# Patient Record
Sex: Female | Born: 1980 | Race: White | Hispanic: No | Marital: Married | Smoking: Never smoker
Health system: Southern US, Community
[De-identification: ages and names within clinical notes are randomized; demographics above are authoritative.]

## PROBLEM LIST (undated history)

## (undated) DIAGNOSIS — Z972 Presence of dental prosthetic device (complete) (partial): Secondary | ICD-10-CM

## (undated) DIAGNOSIS — K610 Anal abscess: Secondary | ICD-10-CM

---

## 2011-02-22 ENCOUNTER — Other Ambulatory Visit (HOSPITAL_COMMUNITY): Payer: Self-pay | Admitting: Obstetrics and Gynecology

## 2011-02-22 DIAGNOSIS — N979 Female infertility, unspecified: Secondary | ICD-10-CM

## 2011-02-25 ENCOUNTER — Ambulatory Visit (HOSPITAL_COMMUNITY)
Admission: RE | Admit: 2011-02-25 | Discharge: 2011-02-25 | Disposition: A | Discharge status: Home / Self Care | Payer: BC Managed Care – PPO | Source: Ambulatory Visit | Attending: Obstetrics and Gynecology | Admitting: Obstetrics and Gynecology

## 2011-02-25 DIAGNOSIS — N979 Female infertility, unspecified: Secondary | ICD-10-CM | POA: Insufficient documentation

## 2012-10-15 IMAGING — RF DG HYSTEROGRAM
4 series · 4 of 4 positions shown · IV contrast (omnipaque)
Comparison: none

CLINICAL DATA: Primary infertility.  Assess tubal patency

HYSTEROSALPINGOGRAM
TECHNIQUE: Following cleansing of the cervix and vagina with
Betadine solution, a hysterosalpingogram was performed using a 5-
French hysterosalpingogram catheter and Omnipaque 300 contrast.
The patient tolerated the examination without difficulty.
Fluoroscopy time: 0.6 minutes.

[Series 1: run · 1 of 1 slices shown (1 of 4)]
[im 1/1]
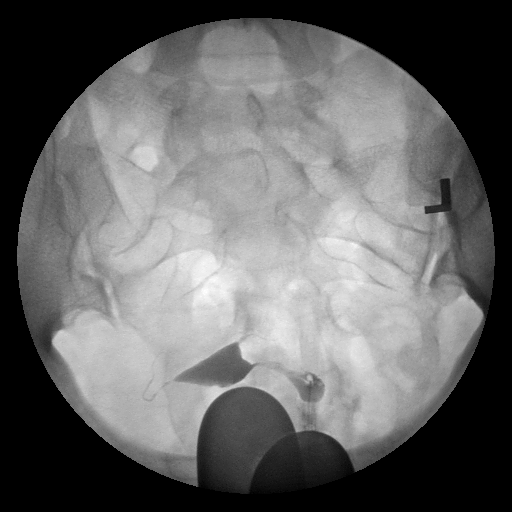

[Series 2: run · 1 of 1 slices shown (2 of 4)]
[im 1/1]
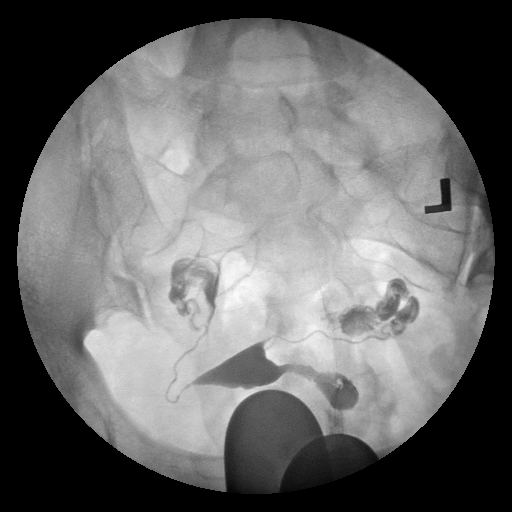

[Series 3: run · 1 of 1 slices shown (3 of 4)]
[im 1/1]
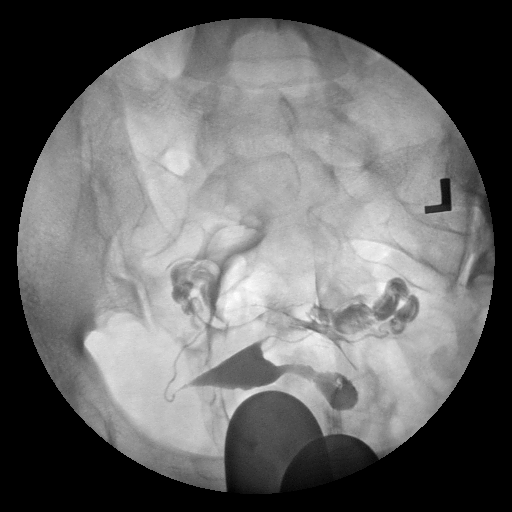

[Series 4: run · 1 of 1 slices shown (4 of 4)]
[im 1/1]
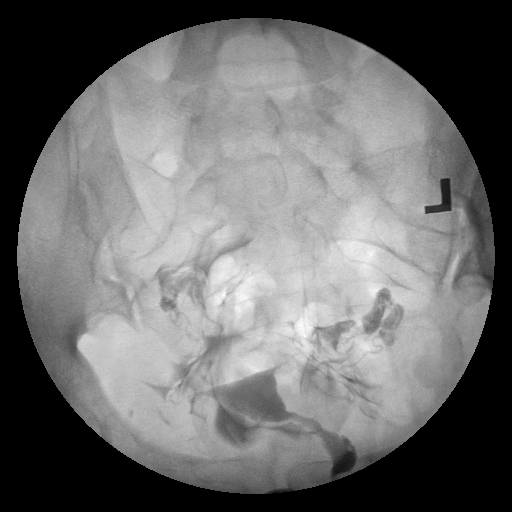

[4 of 4 positions shown; findings below may reference images not displayed]

FINDINGS: A normal endometrial morphology is seen.  Both fallopian
tubes demonstrate a normal morphology and bilateral free
intraperitoneal spill is seen.  No evidence for loculation of
contrast is identified within the pelvis to suggest the presence of
peritubal or periovarian adhesions.
IMPRESSION: Normal HSG

## 2013-07-01 HISTORY — PX: CHOLECYSTECTOMY: SHX55

## 2013-09-16 ENCOUNTER — Encounter (INDEPENDENT_AMBULATORY_CARE_PROVIDER_SITE_OTHER): Payer: Self-pay

## 2013-09-16 ENCOUNTER — Encounter (INDEPENDENT_AMBULATORY_CARE_PROVIDER_SITE_OTHER): Payer: Self-pay | Admitting: Surgery

## 2013-09-16 ENCOUNTER — Encounter (INDEPENDENT_AMBULATORY_CARE_PROVIDER_SITE_OTHER): Payer: Self-pay | Admitting: General Surgery

## 2013-09-16 ENCOUNTER — Ambulatory Visit (INDEPENDENT_AMBULATORY_CARE_PROVIDER_SITE_OTHER): Payer: BC Managed Care – PPO | Admitting: Surgery

## 2013-09-16 VITALS — BP 130/70 | HR 72 | Temp 98.7°F | Resp 14 | Ht 65.0 in | Wt 170.6 lb

## 2013-09-16 DIAGNOSIS — K611 Rectal abscess: Secondary | ICD-10-CM | POA: Insufficient documentation

## 2013-09-16 DIAGNOSIS — K612 Anorectal abscess: Secondary | ICD-10-CM

## 2013-09-16 NOTE — Progress Notes (Signed)
Patient ID: Courtney Dennis, female   DOB: Dec 31, 1980, 32 y.o.   MRN: [id number]  Chief Complaint  Patient presents with  . New Evaluation    perirectal abs/poss fistula    HPI Courtney Dennis is a 32 y.o. female.  URGENT OFFICE 09/16/13 referred by Arvilla Meres PA at [facility] for evaluation of perirectal abscess  HPI This is a 32 year old female in good health who presents with her third episode of a perirectal abscess. All 3 episodes have occurred in the same exact position to the left of her anus. These episodes are spaced about 2 years apart. The most recent episode started a week ago. The patient always has a palpable knot in this area but it became painful and started swelling. She was started on Bactrim but this was not helpful in resolving her symptoms. 2 days ago she underwent a local incision and drainage at [facility]. Both of her previous drainages were also at family practice offices. Her symptoms have improved slightly since Saturday. She is referred for possible anal fistula.  History reviewed. No pertinent past medical history.  Past Surgical History  Procedure Laterality Date  . Cholecystectomy      [id number]    Family History  Problem Relation Age of Onset  . Cancer Father     lung    Social History History  Substance Use Topics  . Smoking status: Never Smoker   . Smokeless tobacco: Never Used  . Alcohol Use: Yes     Comment: rare occasion    No Known Allergies  Current Outpatient Prescriptions  Medication Sig Dispense Refill  . HYDROcodone-acetaminophen (NORCO/VICODIN) 5-325 MG per tablet Take 1 tablet by mouth every 6 (six) hours as needed for moderate pain.      Marland Kitchen sulfamethoxazole-trimethoprim (BACTRIM DS) 800-160 MG per tablet Take 1 tablet by mouth 2 (two) times daily.       No current facility-administered medications for this visit.    Review of Systems Review of Systems  Constitutional: Negative for fever, chills and unexpected weight  change.  HENT: Negative for congestion, hearing loss, sore throat, trouble swallowing and voice change.   Eyes: Negative for visual disturbance.  Respiratory: Negative for cough and wheezing.   Cardiovascular: Negative for chest pain, palpitations and leg swelling.  Gastrointestinal: Negative for nausea, vomiting, abdominal pain, diarrhea, constipation, blood in stool, abdominal distention and anal bleeding.  Genitourinary: Negative for hematuria, vaginal bleeding and difficulty urinating.  Musculoskeletal: Negative for arthralgias.  Skin: Negative for rash and wound.  Neurological: Negative for seizures, syncope and headaches.  Hematological: Negative for adenopathy. Does not bruise/bleed easily.  Psychiatric/Behavioral: Negative for confusion.    Blood pressure 130/70, pulse 72, temperature 98.7 F (37.1 C), temperature source Temporal, resp. rate 14, height 5\' 5"  (1.651 m), weight 170 lb 9.6 oz (77.384 kg).  Physical Exam Physical Exam WDWN in NAD Rectal - no significant hemorrhoidal disease; 2 cm to the left of the anus is a small punctate opening with some minimal purulent drainage.  No surrounding swelling and minimal erythema.  No masses on digital rectal examination.  No bleeding or purulence noted per rectum Data Reviewed none  Assessment    Recurrent perirectal abscess     Plan    Based on the appearance of this drainage, my feeling is that this abscess has never been adequately drained. This wound has never been opened and packed. This likely contributes to the repeated recurrences. At this time the abscess seems to be healing. I  would not repeat the incision and drainage at this time. I would like to see her back in 3-4 weeks for followup visit. I also encouraged her to come see [place] for this problem rather go to urgent care. She may finish her current course of antibiotics.        Jessy Cybulski K. 09/16/2013, 3:59 PM

## 2013-09-23 ENCOUNTER — Ambulatory Visit (INDEPENDENT_AMBULATORY_CARE_PROVIDER_SITE_OTHER): Payer: BC Managed Care – PPO | Admitting: Surgery

## 2013-09-23 ENCOUNTER — Encounter (INDEPENDENT_AMBULATORY_CARE_PROVIDER_SITE_OTHER): Payer: Self-pay | Admitting: Surgery

## 2013-09-23 VITALS — BP 116/78 | HR 60 | Resp 16 | Ht 65.0 in | Wt 173.0 lb

## 2013-09-23 DIAGNOSIS — K612 Anorectal abscess: Secondary | ICD-10-CM

## 2013-09-23 DIAGNOSIS — K611 Rectal abscess: Secondary | ICD-10-CM

## 2013-09-23 NOTE — Progress Notes (Signed)
Followup of her perirectal abscess. She had some concern last week because she was having more drainage. It seems to be healing up nicely. She is done with her antibiotics.  Filed Vitals:   09/23/13 1512  BP: 116/78  Pulse: 60  Resp: 16     The opening is almost completely healed. The induration and erythema have resolved. No masses on digital rectal examination. No gross blood or purulence noted. The patient to continue with Neosporin over this area until completely healed. She has an appointment for 2 weeks.  Wilmon Arms. Corliss Skains, MD, [facility]  General/ Trauma Surgery  09/23/2013 3:22 PM

## 2013-09-30 ENCOUNTER — Encounter (INDEPENDENT_AMBULATORY_CARE_PROVIDER_SITE_OTHER): Payer: BC Managed Care – PPO | Admitting: Surgery

## 2013-09-30 DIAGNOSIS — K610 Anal abscess: Secondary | ICD-10-CM

## 2013-09-30 HISTORY — DX: Anal abscess: K61.0

## 2013-10-07 ENCOUNTER — Ambulatory Visit (INDEPENDENT_AMBULATORY_CARE_PROVIDER_SITE_OTHER): Payer: BC Managed Care – PPO | Admitting: Surgery

## 2013-10-07 ENCOUNTER — Encounter (HOSPITAL_BASED_OUTPATIENT_CLINIC_OR_DEPARTMENT_OTHER): Payer: Self-pay | Admitting: *Deleted

## 2013-10-07 ENCOUNTER — Encounter (INDEPENDENT_AMBULATORY_CARE_PROVIDER_SITE_OTHER): Payer: Self-pay | Admitting: Surgery

## 2013-10-07 ENCOUNTER — Telehealth (INDEPENDENT_AMBULATORY_CARE_PROVIDER_SITE_OTHER): Payer: Self-pay | Admitting: Surgery

## 2013-10-07 VITALS — BP 120/64 | HR 66 | Temp 98.9°F | Resp 14 | Ht 65.0 in | Wt 177.2 lb

## 2013-10-07 DIAGNOSIS — K612 Anorectal abscess: Secondary | ICD-10-CM

## 2013-10-07 DIAGNOSIS — K611 Rectal abscess: Secondary | ICD-10-CM

## 2013-10-07 NOTE — Telephone Encounter (Signed)
Surgery 10/10/13  Please schedule post op  Thanks

## 2013-10-07 NOTE — Progress Notes (Signed)
Patient ID: Courtney Dennis, female   DOB: 11/07/1980, 32 y.o.   MRN: [id number] Patient presents with   .  New Evaluation     perirectal abs/poss fistula   HPI  Courtney Dennis is a 31 y.o. female.Referred by Kelly Phillips PA at [facility] for evaluation of perirectal abscess  HPI  This is a 31-year-old female in good health who presents with her third episode of a perirectal abscess. All 3 episodes have occurred in the same exact position to the left of her anus. These episodes are spaced about 2 years apart. The most recent episode started a week ago. The patient always has a palpable knot in this area but it became painful and started swelling. She was started on Bactrim but this was not helpful in resolving her symptoms. 2 days ago she underwent a local incision and drainage at [facility]. Both of her previous drainages were also at family practice offices. Her symptoms have improved slightly since Saturday. She is referred for possible anal fistula.   The area has improved on antibiotics, but continues to drain small amounts each day.  Still with some tenderness.    History reviewed. No pertinent past medical history.  Past Surgical History   Procedure  Laterality  Date   .  Cholecystectomy       [id number]    Family History   Problem  Relation  Age of Onset   .  Cancer  Father      lung   Social History  History   Substance Use Topics   .  Smoking status:  Never Smoker   .  Smokeless tobacco:  Never Used   .  Alcohol Use:  Yes      Comment: rare occasion   No Known Allergies  Current Outpatient Prescriptions   Medication  Sig  Dispense  Refill   .  HYDROcodone-acetaminophen (NORCO/VICODIN) 5-325 MG per tablet  Take 1 tablet by mouth every 6 (six) hours as needed for moderate pain.     .  sulfamethoxazole-trimethoprim (BACTRIM DS) 800-160 MG per tablet  Take 1 tablet by mouth 2 (two) times daily.      No current facility-administered medications for this visit.   Review of  Systems  Review of Systems  Constitutional: Negative for fever, chills and unexpected weight change.  HENT: Negative for congestion, hearing loss, sore throat, trouble swallowing and voice change.  Eyes: Negative for visual disturbance.  Respiratory: Negative for cough and wheezing.  Cardiovascular: Negative for chest pain, palpitations and leg swelling.  Gastrointestinal: Negative for nausea, vomiting, abdominal pain, diarrhea, constipation, blood in stool, abdominal distention and anal bleeding.  Genitourinary: Negative for hematuria, vaginal bleeding and difficulty urinating.  Musculoskeletal: Negative for arthralgias.  Skin: Negative for rash and wound.  Neurological: Negative for seizures, syncope and headaches.  Hematological: Negative for adenopathy. Does not bruise/bleed easily.  Psychiatric/Behavioral: Negative for confusion.  Blood pressure 130/70, pulse 72, temperature 98.7 F (37.1 C), temperature source Temporal, resp. rate 14, height 5' 5" (1.651 m), weight 170 lb 9.6 oz (77.384 kg).  Physical Exam  Physical Exam  WDWN in NAD HEENT:  EOMI, sclera anicteric Neck:  No masses, no thyromegaly Lungs:  CTA bilaterally; normal respiratory effort CV:  Regular rate and rhythm; no murmurs Abd:  +bowel sounds, soft, non-tender, no masses Rectal - located to the left of the anus about 2 cm from the edge of the anus, there is a small opening without induration or erythema; small amount   of drainage Ext:  Well-perfused; no edema Skin:  Warm, dry; no sign of jaundice  Data Reviewed  none  Assessment  Recurrent perirectal abscess  Plan  Since she continues to have drainage from this area, there is possibility that this has not been adequately drained or that she has a small chronic fistula to this area. I would recommend an examination under anesthesia with a probe into the abscess to see if there is communication to the rectum. There is, that we would perform a fistulotomy. The  surgical procedure has been discussed with the patient.  Potential risks, benefits, alternative treatments, and expected outcomes have been explained.  All of the patient's questions at this time have been answered.  The likelihood of reaching the patient's treatment goal is good.  The patient understand the proposed surgical procedure and wishes to proceed.    Freddie Dymek K. Reyah Streeter, MD, FACS [facility]  General/ Trauma Surgery  10/07/2013 11:10 AM   

## 2013-10-10 ENCOUNTER — Encounter (HOSPITAL_BASED_OUTPATIENT_CLINIC_OR_DEPARTMENT_OTHER): Payer: BC Managed Care – PPO | Admitting: Anesthesiology

## 2013-10-10 ENCOUNTER — Ambulatory Visit (HOSPITAL_BASED_OUTPATIENT_CLINIC_OR_DEPARTMENT_OTHER)
Admission: RE | Admit: 2013-10-10 | Discharge: 2013-10-10 | Disposition: A | Discharge status: Home / Self Care | Payer: BC Managed Care – PPO | Source: Ambulatory Visit | Attending: Surgery | Admitting: Surgery

## 2013-10-10 ENCOUNTER — Encounter (HOSPITAL_BASED_OUTPATIENT_CLINIC_OR_DEPARTMENT_OTHER)
Admission: RE | Disposition: A | Discharge status: Home / Self Care | Payer: Self-pay | Source: Ambulatory Visit | Attending: Surgery

## 2013-10-10 ENCOUNTER — Ambulatory Visit (HOSPITAL_BASED_OUTPATIENT_CLINIC_OR_DEPARTMENT_OTHER): Payer: BC Managed Care – PPO | Admitting: Anesthesiology

## 2013-10-10 ENCOUNTER — Encounter (HOSPITAL_BASED_OUTPATIENT_CLINIC_OR_DEPARTMENT_OTHER): Payer: Self-pay | Admitting: Anesthesiology

## 2013-10-10 DIAGNOSIS — Z79899 Other long term (current) drug therapy: Secondary | ICD-10-CM | POA: Insufficient documentation

## 2013-10-10 DIAGNOSIS — K612 Anorectal abscess: Secondary | ICD-10-CM | POA: Insufficient documentation

## 2013-10-10 DIAGNOSIS — K611 Rectal abscess: Secondary | ICD-10-CM

## 2013-10-10 HISTORY — PX: EVALUATION UNDER ANESTHESIA WITH FISTULECTOMY: SHX5623

## 2013-10-10 HISTORY — DX: Anal abscess: K61.0

## 2013-10-10 HISTORY — DX: Presence of dental prosthetic device (complete) (partial): Z97.2

## 2013-10-10 SURGERY — EXAM UNDER ANESTHESIA WITH FISTULECTOMY
Anesthesia: General

## 2013-10-10 MED ORDER — MORPHINE SULFATE 2 MG/ML IJ SOLN: 2.0000 mg | INTRAMUSCULAR | Status: DC | PRN

## 2013-10-10 MED ORDER — ONDANSETRON HCL 4 MG/2ML IJ SOLN: 4.0000 mg | INTRAMUSCULAR | Status: DC | PRN

## 2013-10-10 MED ORDER — BUPIVACAINE LIPOSOME 1.3 % IJ SUSP
INTRAMUSCULAR | Status: AC
  Filled 2013-10-10: qty 20

## 2013-10-10 MED ORDER — MIDAZOLAM HCL 5 MG/5ML IJ SOLN
INTRAMUSCULAR | Status: DC | PRN
  Administered 2013-10-10: 2 mg via INTRAVENOUS

## 2013-10-10 MED ORDER — FENTANYL CITRATE 0.05 MG/ML IJ SOLN
INTRAMUSCULAR | Status: AC
  Filled 2013-10-10: qty 6

## 2013-10-10 MED ORDER — OXYCODONE-ACETAMINOPHEN 5-325 MG PO TABS: 1.0000 | ORAL_TABLET | ORAL | Status: DC | PRN

## 2013-10-10 MED ORDER — PROPOFOL 10 MG/ML IV BOLUS
INTRAVENOUS | Status: DC | PRN
  Administered 2013-10-10: 200 mg via INTRAVENOUS

## 2013-10-10 MED ORDER — CEFAZOLIN SODIUM-DEXTROSE 2-3 GM-% IV SOLR: 2.0000 g | INTRAVENOUS | Status: DC

## 2013-10-10 MED ORDER — MIDAZOLAM HCL 2 MG/2ML IJ SOLN: 1.0000 mg | INTRAMUSCULAR | Status: DC | PRN

## 2013-10-10 MED ORDER — FENTANYL CITRATE 0.05 MG/ML IJ SOLN
INTRAMUSCULAR | Status: DC | PRN
  Administered 2013-10-10: 100 ug via INTRAVENOUS

## 2013-10-10 MED ORDER — DIBUCAINE 1 % RE OINT
TOPICAL_OINTMENT | RECTAL | Status: AC
  Filled 2013-10-10: qty 28

## 2013-10-10 MED ORDER — FENTANYL CITRATE 0.05 MG/ML IJ SOLN: 50.0000 ug | INTRAMUSCULAR | Status: DC | PRN

## 2013-10-10 MED ORDER — DEXAMETHASONE SODIUM PHOSPHATE 4 MG/ML IJ SOLN
INTRAMUSCULAR | Status: DC | PRN
  Administered 2013-10-10: 10 mg via INTRAVENOUS

## 2013-10-10 MED ORDER — OXYCODONE HCL 5 MG PO TABS: 5.0000 mg | ORAL_TABLET | Freq: Once | ORAL | Status: DC | PRN

## 2013-10-10 MED ORDER — MIDAZOLAM HCL 2 MG/ML PO SYRP: 12.0000 mg | ORAL_SOLUTION | Freq: Once | ORAL | Status: DC | PRN

## 2013-10-10 MED ORDER — LACTATED RINGERS IV SOLN
INTRAVENOUS | Status: DC
  Administered 2013-10-10: 14:00:00 via INTRAVENOUS

## 2013-10-10 MED ORDER — OXYCODONE HCL 5 MG/5ML PO SOLN: 5.0000 mg | Freq: Once | ORAL | Status: DC | PRN

## 2013-10-10 MED ORDER — LIDOCAINE HCL (CARDIAC) 20 MG/ML IV SOLN
INTRAVENOUS | Status: DC | PRN
  Administered 2013-10-10: 60 mg via INTRAVENOUS

## 2013-10-10 MED ORDER — MIDAZOLAM HCL 2 MG/2ML IJ SOLN
INTRAMUSCULAR | Status: AC
  Filled 2013-10-10: qty 2

## 2013-10-10 MED ORDER — HYDROMORPHONE HCL PF 1 MG/ML IJ SOLN: 0.2500 mg | INTRAMUSCULAR | Status: DC | PRN

## 2013-10-10 MED ORDER — BUPIVACAINE LIPOSOME 1.3 % IJ SUSP
INTRAMUSCULAR | Status: DC | PRN
  Administered 2013-10-10: 20 mL

## 2013-10-10 MED ORDER — KETOROLAC TROMETHAMINE 30 MG/ML IJ SOLN
INTRAMUSCULAR | Status: DC | PRN
  Administered 2013-10-10: 30 mg via INTRAVENOUS

## 2013-10-10 MED ORDER — METOCLOPRAMIDE HCL 5 MG/ML IJ SOLN: 10.0000 mg | Freq: Once | INTRAMUSCULAR | Status: DC | PRN

## 2013-10-10 MED ORDER — CHLORHEXIDINE GLUCONATE 4 % EX LIQD: 1.0000 "application " | Freq: Once | CUTANEOUS | Status: DC

## 2013-10-10 SURGICAL SUPPLY — 41 items
BENZOIN TINCTURE PRP APPL 2/3 (GAUZE/BANDAGES/DRESSINGS) IMPLANT
BLADE SURG 15 STRL LF DISP TIS (BLADE) ×1 IMPLANT
BLADE SURG 15 STRL SS (BLADE) ×1
BRIEF STRETCH FOR OB PAD LRG (UNDERPADS AND DIAPERS) ×2 IMPLANT
CANISTER SUCT 1200ML W/VALVE (MISCELLANEOUS) ×2 IMPLANT
COVER MAYO STAND STRL (DRAPES) IMPLANT
COVER TABLE BACK 60X90 (DRAPES) IMPLANT
DECANTER SPIKE VIAL GLASS SM (MISCELLANEOUS) IMPLANT
DRAPE PED LAPAROTOMY (DRAPES) IMPLANT
DRAPE UTILITY XL STRL (DRAPES) ×2 IMPLANT
ELECT REM PT RETURN 9FT ADLT (ELECTROSURGICAL) ×2
ELECTRODE REM PT RTRN 9FT ADLT (ELECTROSURGICAL) ×1 IMPLANT
GAUZE PACKING IODOFORM 1/4X5 (PACKING) ×2 IMPLANT
GAUZE SPONGE 4X4 12PLY STRL LF (GAUZE/BANDAGES/DRESSINGS) IMPLANT
GAUZE VASELINE 1X8 (GAUZE/BANDAGES/DRESSINGS) IMPLANT
GLOVE BIO SURGEON STRL SZ7 (GLOVE) ×2 IMPLANT
GLOVE BIOGEL PI IND STRL 7.0 (GLOVE) ×1 IMPLANT
GLOVE BIOGEL PI IND STRL 7.5 (GLOVE) ×2 IMPLANT
GLOVE BIOGEL PI INDICATOR 7.0 (GLOVE) ×1
GLOVE BIOGEL PI INDICATOR 7.5 (GLOVE) ×2
GLOVE SURG SS PI 7.0 STRL IVOR (GLOVE) ×2 IMPLANT
GOWN PREVENTION PLUS XLARGE (GOWN DISPOSABLE) ×4 IMPLANT
NEEDLE HYPO 25X1 1.5 SAFETY (NEEDLE) ×2 IMPLANT
PACK BASIN DAY SURGERY FS (CUSTOM PROCEDURE TRAY) ×2 IMPLANT
PACK LITHOTOMY IV (CUSTOM PROCEDURE TRAY) ×2 IMPLANT
PAD ABD 8X10 STRL (GAUZE/BANDAGES/DRESSINGS) ×2 IMPLANT
PENCIL BUTTON HOLSTER BLD 10FT (ELECTRODE) ×2 IMPLANT
SPONGE SURGIFOAM ABS GEL 100 (HEMOSTASIS) IMPLANT
SURGILUBE 2OZ TUBE FLIPTOP (MISCELLANEOUS) ×2 IMPLANT
SUT CHROMIC 2 0 SH (SUTURE) IMPLANT
SUT CHROMIC 3 0 SH 27 (SUTURE) IMPLANT
SUT VIC AB 3-0 SH 27 (SUTURE)
SUT VIC AB 3-0 SH 27X BRD (SUTURE) IMPLANT
SYR CONTROL 10ML LL (SYRINGE) ×2 IMPLANT
TAPE CLOTH 3X10 TAN LF (GAUZE/BANDAGES/DRESSINGS) IMPLANT
TOWEL OR 17X24 6PK STRL BLUE (TOWEL DISPOSABLE) ×2 IMPLANT
TOWEL OR NON WOVEN STRL DISP B (DISPOSABLE) ×2 IMPLANT
TRAY DSU PREP LF (CUSTOM PROCEDURE TRAY) ×2 IMPLANT
TRAY PROCTOSCOPIC FIBER OPTIC (SET/KITS/TRAYS/PACK) IMPLANT
TUBE CONNECTING 20X1/4 (TUBING) ×2 IMPLANT
YANKAUER SUCT BULB TIP NO VENT (SUCTIONS) ×2 IMPLANT

## 2013-10-10 NOTE — Transfer of Care (Signed)
Immediate Anesthesia Transfer of Care Note  Patient: Courtney Dennis  Procedure(s) Performed: Procedure(s): EXAM UNDER ANESTHESIA WITH FISTULotomy  (N/A)  Patient Location: PACU  Anesthesia Type:General  Level of Consciousness: awake, alert  and oriented  Airway & Oxygen Therapy: Patient Spontanous Breathing and Patient connected to face mask oxygen  Post-op Assessment: Report given to PACU RN and Post -op Vital signs reviewed and stable  Post vital signs: Reviewed and stable  Complications: No apparent anesthesia complications

## 2013-10-10 NOTE — Anesthesia Postprocedure Evaluation (Signed)
Anesthesia Post Note  Patient: Courtney Dennis  Procedure(s) Performed: Procedure(s) (LRB): EXAM UNDER ANESTHESIA WITH EXCISION OF PERIRECTAL ABSCESS  (N/A)  Anesthesia type: General  Patient location: PACU  Post pain: Pain level controlled  Post assessment: Patient's Cardiovascular Status Stable  Last Vitals:  Filed Vitals:   10/10/13 1545  BP: 113/61  Pulse: 60  Temp:   Resp: 13    Post vital signs: Reviewed and stable  Level of consciousness: alert  Complications: No apparent anesthesia complications

## 2013-10-10 NOTE — Anesthesia Procedure Notes (Signed)
Procedure Name: LMA Insertion Performed by: Emersyn Kotarski W Pre-anesthesia Checklist: Patient identified, Timeout performed, Emergency Drugs available, Suction available and Patient being monitored Patient Re-evaluated:Patient Re-evaluated prior to inductionOxygen Delivery Method: Circle system utilized Preoxygenation: Pre-oxygenation with 100% oxygen Intubation Type: IV induction Ventilation: Mask ventilation without difficulty LMA: LMA inserted LMA Size: 4.0 Number of attempts: 1 Placement Confirmation: breath sounds checked- equal and bilateral and positive ETCO2 Tube secured with: Tape Dental Injury: Teeth and Oropharynx as per pre-operative assessment      

## 2013-10-10 NOTE — Op Note (Signed)
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 Anesthesia: Gen. Via LMA Indications: This is a 32 year old female who presents with a recurrent perirectal abscess in the left anterior location. This has occurred 3 times over the last 5 years. She continues to have some discomfort and drainage from this area. All of the previous drainages have been performed by primary care physicians.  It is unclear whether she has a chronic fistula or if the abscess has never been adequately drained.  Description of procedure: The patient was brought to the operating room and placed in a supine position on the operating room table. After an adequate level of general anesthesia was obtained, her legs were placed in yellowfin stirrups in lithotomy position. Her perineum was prepped with Betadine and draped in sterile fashion. A timeout was taken to ensure the proper patient and proper procedure. The left anterior perirectal abscess shows no skin erythema but there is a small opening with some drainage. He we dilated the anus up to 3 fingers and inserted the silver bullet retractor. The patient has minimal hemorrhoidal disease. I infiltrated 20 cc of Exparel around the sphincter and the abscess. I excised a 1 cm circle of skin around the abscess cavity. We then probed into the abscess cavity with the fistula probe. I inserted 2 fingers into the rectum.  An attempt to demonstrate a fistula between the abscess and the rectum but there was no communication. The abscess tracks for a distance of about 2-1/2 cm. I excised a lot of the old granulation tissue with cautery. We inspected for hemostasis. The wound was packed with iodoform gauze. A dry dressing was applied. The patient was then extubated and brought to the recovery room in stable condition.  All sponge, instrument, and needle counts are correct.  Wilmon Arms. Corliss Skains, MD, [facility]  General/ Trauma Surgery  10/10/2013 2:53 PM

## 2013-10-10 NOTE — Interval H&P Note (Signed)
History and Physical Interval Note:  10/10/2013 1:52 PM  [name]  has presented today for surgery, with the diagnosis of chronic perianal abscess   The various methods of treatment have been discussed with the patient and family. After consideration of risks, benefits and other options for treatment, the patient has consented to  Procedure(s): EXAM UNDER ANESTHESIA WITH FISTULotomy  (N/A) as a surgical intervention .  The patient's history has been reviewed, patient examined, no change in status, stable for surgery.  I have reviewed the patient's chart and labs.  Questions were answered to the patient's satisfaction.     Courtney Douthitt K.

## 2013-10-10 NOTE — Anesthesia Preprocedure Evaluation (Signed)
Anesthesia Evaluation  Patient identified by MRN, date of birth, ID band Patient awake    Reviewed: Allergy & Precautions, H&P , NPO status , Patient's Chart, lab work & pertinent test results, reviewed documented beta blocker date and time   Airway Mallampati: II TM Distance: >3 FB Neck ROM: full    Dental   Pulmonary neg pulmonary ROS,  breath sounds clear to auscultation        Cardiovascular negative cardio ROS  Rhythm:regular     Neuro/Psych negative neurological ROS  negative psych ROS   GI/Hepatic negative GI ROS, Neg liver ROS,   Endo/Other  negative endocrine ROS  Renal/GU negative Renal ROS  negative genitourinary   Musculoskeletal   Abdominal   Peds  Hematology negative hematology ROS (+)   Anesthesia Other Findings See surgeon's H&P   Reproductive/Obstetrics negative OB ROS                           Anesthesia Physical Anesthesia Plan  ASA: I  Anesthesia Plan: General   Post-op Pain Management:    Induction: Intravenous  Airway Management Planned: LMA  Additional Equipment:   Intra-op Plan:   Post-operative Plan:   Informed Consent: I have reviewed the patients History and Physical, chart, labs and discussed the procedure including the risks, benefits and alternatives for the proposed anesthesia with the patient or authorized representative who has indicated his/her understanding and acceptance.   Dental Advisory Given  Plan Discussed with: CRNA and Surgeon  Anesthesia Plan Comments:         Anesthesia Quick Evaluation  

## 2013-10-10 NOTE — H&P (View-Only) (Signed)
Patient ID: Courtney Dennis, female   DOB: 04-13-1981, 32 y.o.   MRN: [id number] Patient presents with   .  New Evaluation     perirectal abs/poss fistula   HPI  [name] is a 32 y.o. female.Referred by Arvilla Meres PA at [facility] for evaluation of perirectal abscess  HPI  This is a 32 year old female in good health who presents with her third episode of a perirectal abscess. All 3 episodes have occurred in the same exact position to the left of her anus. These episodes are spaced about 2 years apart. The most recent episode started a week ago. The patient always has a palpable knot in this area but it became painful and started swelling. She was started on Bactrim but this was not helpful in resolving her symptoms. 2 days ago she underwent a local incision and drainage at [facility]. Both of her previous drainages were also at family practice offices. Her symptoms have improved slightly since Saturday. She is referred for possible anal fistula.   The area has improved on antibiotics, but continues to drain small amounts each day.  Still with some tenderness.    History reviewed. No pertinent past medical history.  Past Surgical History   Procedure  Laterality  Date   .  Cholecystectomy       [id number]    Family History   Problem  Relation  Age of Onset   .  Cancer  Father      lung   Social History  History   Substance Use Topics   .  Smoking status:  Never Smoker   .  Smokeless tobacco:  Never Used   .  Alcohol Use:  Yes      Comment: rare occasion   No Known Allergies  Current Outpatient Prescriptions   Medication  Sig  Dispense  Refill   .  HYDROcodone-acetaminophen (NORCO/VICODIN) 5-325 MG per tablet  Take 1 tablet by mouth every 6 (six) hours as needed for moderate pain.     Marland Kitchen  sulfamethoxazole-trimethoprim (BACTRIM DS) 800-160 MG per tablet  Take 1 tablet by mouth 2 (two) times daily.      No current facility-administered medications for this visit.   Review of  Systems  Review of Systems  Constitutional: Negative for fever, chills and unexpected weight change.  HENT: Negative for congestion, hearing loss, sore throat, trouble swallowing and voice change.  Eyes: Negative for visual disturbance.  Respiratory: Negative for cough and wheezing.  Cardiovascular: Negative for chest pain, palpitations and leg swelling.  Gastrointestinal: Negative for nausea, vomiting, abdominal pain, diarrhea, constipation, blood in stool, abdominal distention and anal bleeding.  Genitourinary: Negative for hematuria, vaginal bleeding and difficulty urinating.  Musculoskeletal: Negative for arthralgias.  Skin: Negative for rash and wound.  Neurological: Negative for seizures, syncope and headaches.  Hematological: Negative for adenopathy. Does not bruise/bleed easily.  Psychiatric/Behavioral: Negative for confusion.  Blood pressure 130/70, pulse 72, temperature 98.7 F (37.1 C), temperature source Temporal, resp. rate 14, height 5\' 5"  (1.651 m), weight 170 lb 9.6 oz (77.384 kg).  Physical Exam  Physical Exam  WDWN in NAD HEENT:  EOMI, sclera anicteric Neck:  No masses, no thyromegaly Lungs:  CTA bilaterally; normal respiratory effort CV:  Regular rate and rhythm; no murmurs Abd:  +bowel sounds, soft, non-tender, no masses Rectal - located to the left of the anus about 2 cm from the edge of the anus, there is a small opening without induration or erythema; small amount  of drainage Ext:  Well-perfused; no edema Skin:  Warm, dry; no sign of jaundice  Data Reviewed  none  Assessment  Recurrent perirectal abscess  Plan  Since she continues to have drainage from this area, there is possibility that this has not been adequately drained or that she has a small chronic fistula to this area. I would recommend an examination under anesthesia with a probe into the abscess to see if there is communication to the rectum. There is, that we would perform a fistulotomy. The  surgical procedure has been discussed with the patient.  Potential risks, benefits, alternative treatments, and expected outcomes have been explained.  All of the patient's questions at this time have been answered.  The likelihood of reaching the patient's treatment goal is good.  The patient understand the proposed surgical procedure and wishes to proceed.    Wilmon Arms. Corliss Skains, MD, [facility] Surgery  General/ Trauma Surgery  10/07/2013 11:10 AM

## 2013-10-11 ENCOUNTER — Encounter (HOSPITAL_BASED_OUTPATIENT_CLINIC_OR_DEPARTMENT_OTHER): Payer: Self-pay | Admitting: Surgery

## 2013-10-11 ENCOUNTER — Telehealth (INDEPENDENT_AMBULATORY_CARE_PROVIDER_SITE_OTHER): Payer: Self-pay

## 2013-10-11 NOTE — Telephone Encounter (Signed)
Pt is s/p excision and drainage of perirectal abscess no 10/10/13 by Dr. Corliss Skains.  She is calling this morning for confirmation of her discharge instructions.  Pt read back all instructions which were to remove the packing 24 hours after surgery, daily sitz baths, and apply Neosporin on q-tip to the wound.  Pt was instructed not to touch the wound to try to express drainage.  Pt seemed very confident with allinstructions and agrees with POC.

## 2013-11-11 ENCOUNTER — Encounter (INDEPENDENT_AMBULATORY_CARE_PROVIDER_SITE_OTHER): Payer: BC Managed Care – PPO | Admitting: Surgery

## 2013-11-19 ENCOUNTER — Encounter (INDEPENDENT_AMBULATORY_CARE_PROVIDER_SITE_OTHER): Payer: Self-pay | Admitting: Surgery

## 2013-11-19 ENCOUNTER — Ambulatory Visit (INDEPENDENT_AMBULATORY_CARE_PROVIDER_SITE_OTHER): Payer: BC Managed Care – PPO | Admitting: Surgery

## 2013-11-19 VITALS — BP 128/80 | HR 76 | Temp 98.5°F | Resp 14 | Ht 65.0 in | Wt 181.8 lb

## 2013-11-19 DIAGNOSIS — K612 Anorectal abscess: Secondary | ICD-10-CM

## 2013-11-19 DIAGNOSIS — K611 Rectal abscess: Secondary | ICD-10-CM

## 2013-11-19 NOTE — Progress Notes (Signed)
The patient is status post examination under anesthesia and drainage of a perirectal abscess on 10/10/13. She is doing quite well. She had minimal pain after surgery. The wound is healing very nicely. She is not really having any drainage.  On examination the wound is almost completely healed. There is a pinpoint area of granulation tissue that is mildly sensitive. The surrounding skin looks healthy. There is no tunneling or tracking. No sign of abscess.  The patient may stop doing dressing changes and sitz baths. She should call us if she has any further problems. There was no sign of fistula but this was a chronic inadequately drained abscess.  Wilmon ArmsMatthew K. Corliss Skainssuei, MD, [facility]  General/ Trauma Surgery  11/19/2013 9:11 AM

## 2014-09-18 ENCOUNTER — Telehealth (INDEPENDENT_AMBULATORY_CARE_PROVIDER_SITE_OTHER): Payer: Self-pay

## 2014-09-18 NOTE — Telephone Encounter (Signed)
Contacted pt to inform her of Dr Fatima Sangersuei's message. Pt states that she was on her way to see another physician at this time. She will call us back if there was anything she needs from us at this time.

## 2014-09-18 NOTE — Telephone Encounter (Signed)
Pt s/p perirectal abscess on 12/14 by Dr Corliss Skainssuei. Pt was seen on 09/08/14 by Dr Lindie SpruceWyatt for a recurrent abscess by Dr Lindie SpruceWyatt, she was started on abx. Pt returned to the office on 11/18 with Dr Derrell Lollingamirez. He thought the area was getting better. Pt states that the area feels like it is getting bigger and more painful. Pt states that she has been taking Ibuprofen wihout any help. Pt believes that the area is getting larger. Advised the pt that she can come back into the office to see our urgent office dr, she has appt scheduled to see Dr Lindie SpruceWyatt on monday or if she feels bad enough she can go to the ER. Pt states that she would really like to see Dr Corliss Skainssuei. Advised pt that I would send Dr Corliss Skainssuei a message and we will contact her as soon as I recieve a message. Pt verbalized understanding

## 2014-09-18 NOTE — Telephone Encounter (Signed)
Explain to her that I'm not in the office until November 30.  She can see the urgent doctor if needed, and I can see her in follow-up, but it doesn't sound like this should wait that long.  That's just the way it is.  I'm the DOW and I'm gone next week.  Sitz baths frequently.  She can have a prescription for Vicodin #30 but she probably needs to be seen again if it's getting worse.
